# Patient Record
Sex: Male | Born: 1965 | Race: White | Hispanic: No | Marital: Married
Health system: Southern US, Community
[De-identification: ages and names within clinical notes are randomized; demographics above are authoritative.]

---

## 2007-05-09 ENCOUNTER — Ambulatory Visit: Payer: Self-pay | Admitting: Internal Medicine

## 2019-07-21 ENCOUNTER — Ambulatory Visit: Payer: 59 | Attending: Internal Medicine

## 2019-07-21 DIAGNOSIS — Z23 Encounter for immunization: Secondary | ICD-10-CM

## 2019-07-21 NOTE — Progress Notes (Signed)
   Covid-19 Vaccination Clinic  Name:  YANDRIEL BOENING    MRN: [id number] DOB: [birth year]  07/21/2019  Mr. Barrack was observed post Covid-19 immunization for 15 minutes without incident. He was provided with Vaccine Information Sheet and instruction to access the V-Safe system.   Mr. Berrocal was instructed to call 911 with any severe reactions post vaccine: Marland Kitchen Difficulty breathing  . Swelling of face and throat  . A fast heartbeat  . A bad rash all over body  . Dizziness and weakness   Immunizations Administered    Name Date Dose VIS Date Route   Pfizer COVID-19 Vaccine 07/21/2019  3:33 PM 0.3 mL 04/14/2019 Intramuscular   Manufacturer: [employer], [employer]   Lot: [id number]   NDC: [address]-3

## 2019-08-11 ENCOUNTER — Ambulatory Visit: Payer: 59 | Attending: Internal Medicine

## 2019-08-11 DIAGNOSIS — Z23 Encounter for immunization: Secondary | ICD-10-CM

## 2019-08-11 NOTE — Progress Notes (Signed)
   Covid-19 Vaccination Clinic  Name:  Andrew Washington    MRN: [id number] DOB: 1965/07/07  08/11/2019  [name] was observed post Covid-19 immunization for 15 minutes without incident. He was provided with Vaccine Information Sheet and instruction to access the V-Safe system.   [name] was instructed to call 911 with any severe reactions post vaccine: Marland Kitchen Difficulty breathing  . Swelling of face and throat  . A fast heartbeat  . A bad rash all over body  . Dizziness and weakness   Immunizations Administered    Name Date Dose VIS Date Route   Pfizer COVID-19 Vaccine 08/11/2019  3:15 PM 0.3 mL 04/14/2019 Intramuscular   Manufacturer: [employer], [employer]   Lot: [id number]   NDC: [address]-0

## 2021-09-26 ENCOUNTER — Other Ambulatory Visit: Payer: Self-pay | Admitting: Family Medicine

## 2021-09-26 DIAGNOSIS — M545 Low back pain, unspecified: Secondary | ICD-10-CM

## 2021-10-01 ENCOUNTER — Other Ambulatory Visit: Payer: Self-pay | Admitting: Family Medicine

## 2021-10-01 DIAGNOSIS — G8929 Other chronic pain: Secondary | ICD-10-CM

## 2021-10-03 ENCOUNTER — Ambulatory Visit
Admission: RE | Admit: 2021-10-03 | Discharge: 2021-10-03 | Disposition: A | Discharge status: Home / Self Care | Payer: Managed Care, Other (non HMO) | Source: Ambulatory Visit | Attending: Family Medicine | Admitting: Family Medicine

## 2021-10-03 DIAGNOSIS — G8929 Other chronic pain: Secondary | ICD-10-CM

## 2021-10-03 IMAGING — MR MR CERVICAL SPINE W/O CM
4 of 5 series · 27 of 48 positions shown · non-contrast
Comparison: None Available.

CLINICAL DATA: Initial evaluation for neck pain with numbness in
both arms.

EXAM:
MRI CERVICAL SPINE WITHOUT CONTRAST
TECHNIQUE: Multiplanar, multisequence MR imaging of the cervical spine was
performed. No intravenous contrast was administered.

[Series 6: T1 · sagittal · 3.0mm · 0.66mm/px · 6 of 15 slices shown]
[im 1/15]
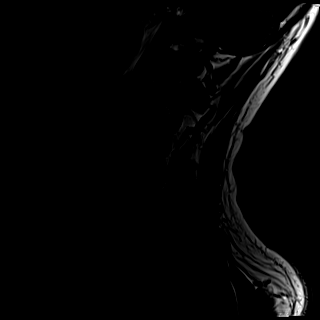
[im 3/15]
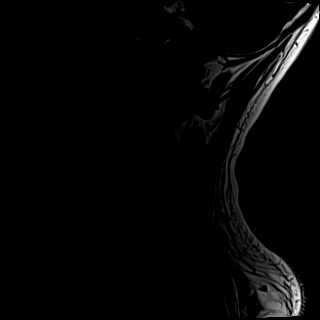
[im 6/15]
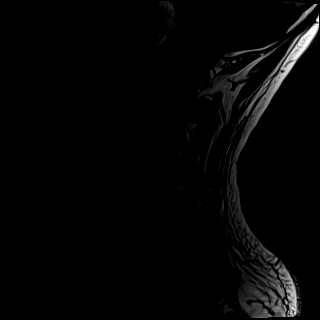
[im 9/15]
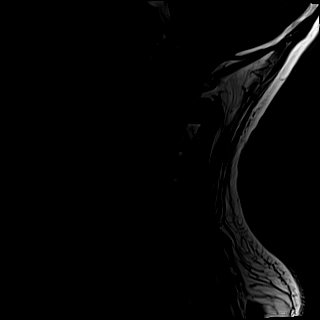
[im 12/15]
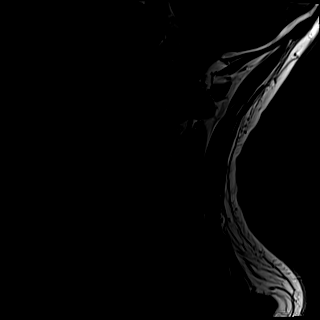
[im 15/15]
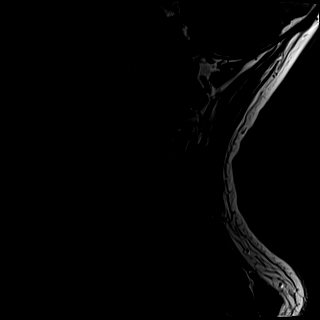

[Series 7: T2 · sagittal · 3.0mm · 0.55mm/px · 6 of 15 slices shown (1 of 2)]
[im 1/15]
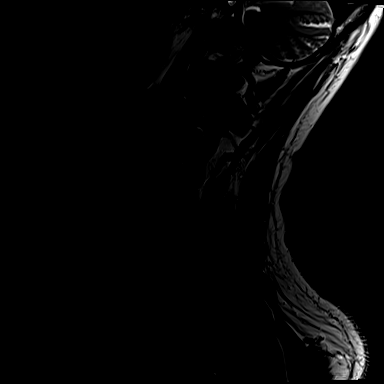
[im 3/15]
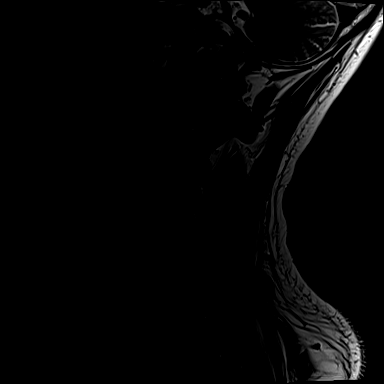
[im 6/15]
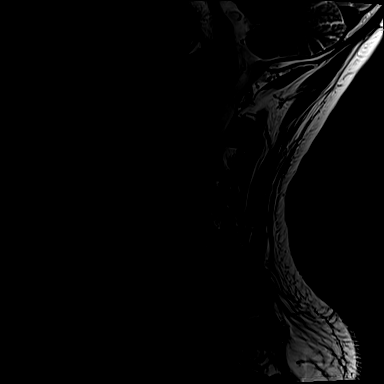
[im 9/15]
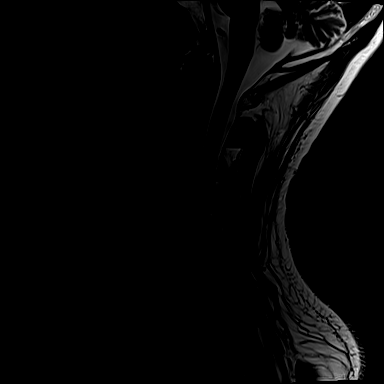
[im 12/15]
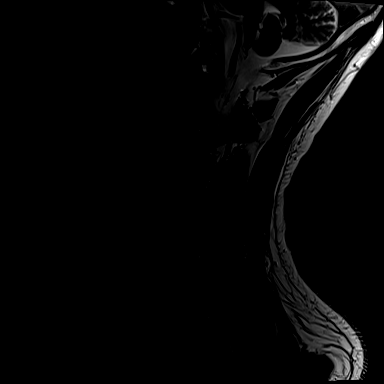
[im 15/15]
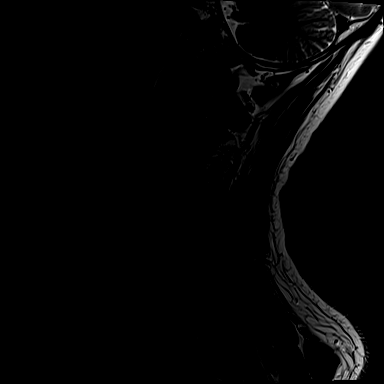

[Series 8: STIR · sagittal · 3.0mm · 0.33mm/px · 6 of 15 slices shown]
[im 1/15]
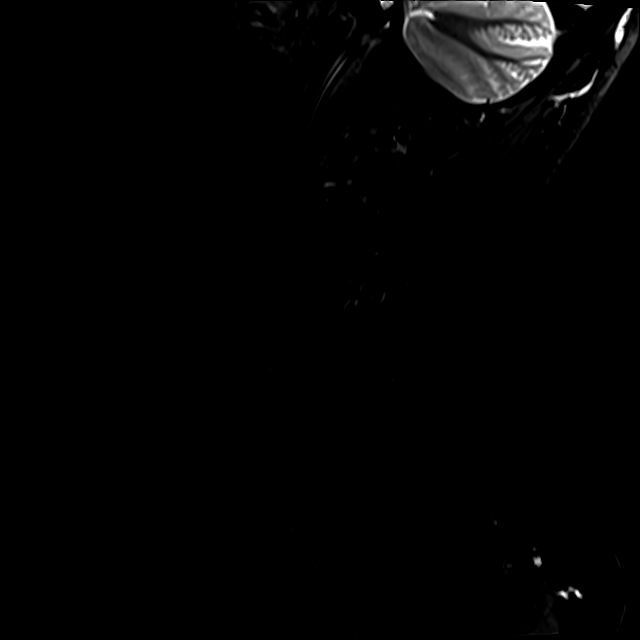
[im 3/15]
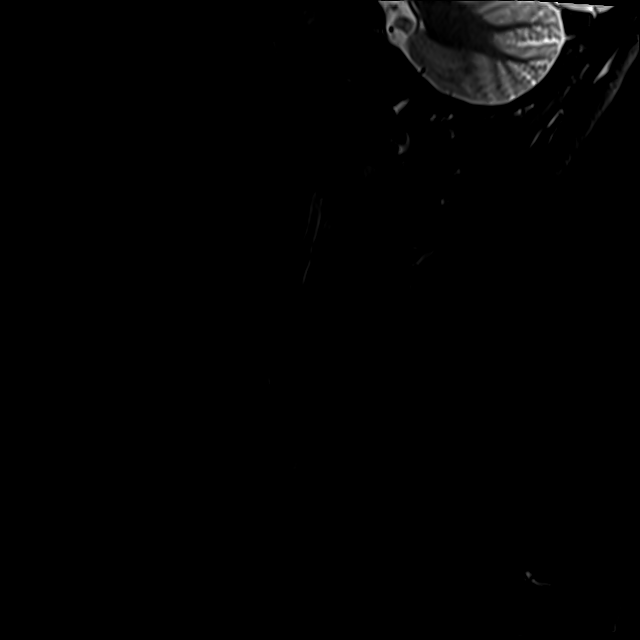
[im 6/15]
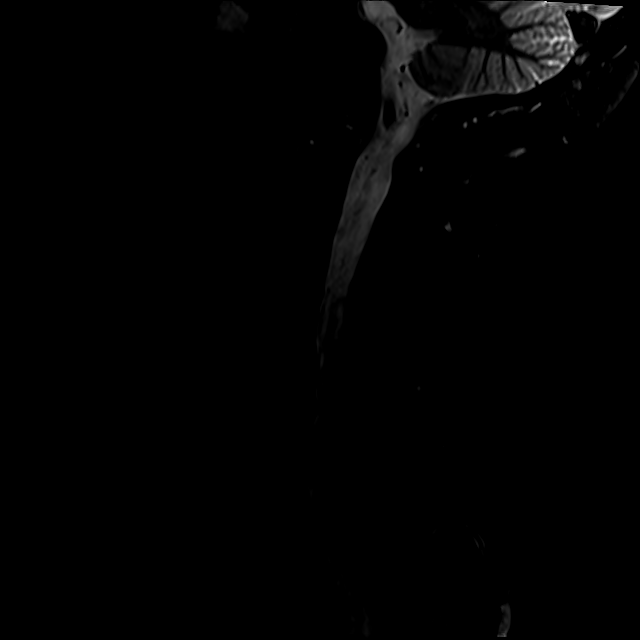
[im 9/15]
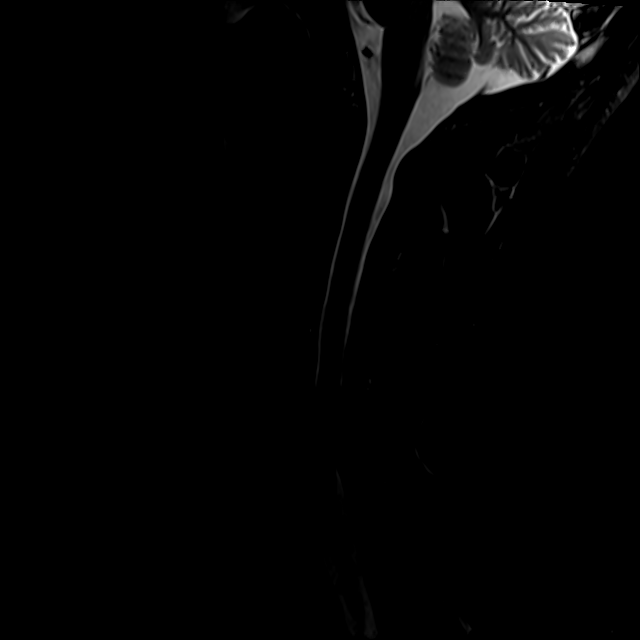
[im 12/15]
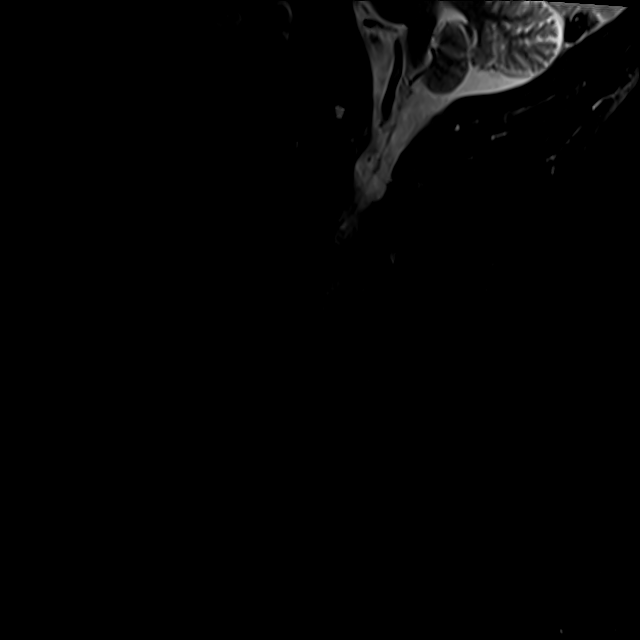
[im 15/15]
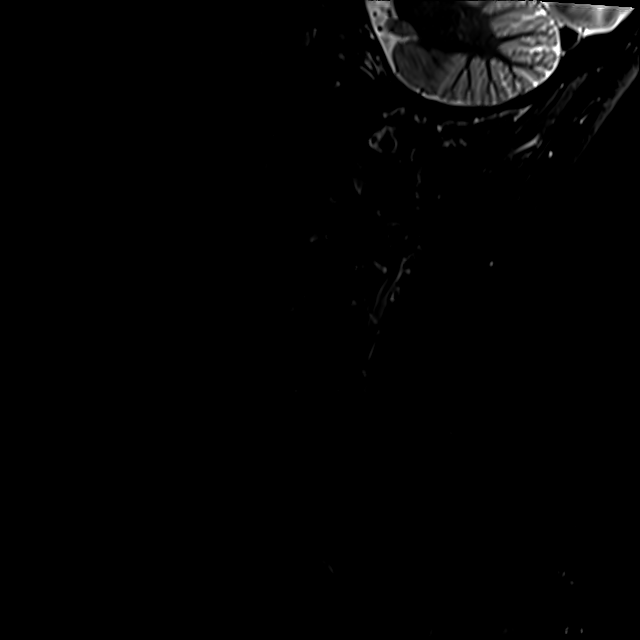

[Series 9: T2 · axial · 3.0mm · 0.50mm/px · z∈[-76,+40]mm · 9 of 38 slices shown (2 of 2)]
[im 1/38]
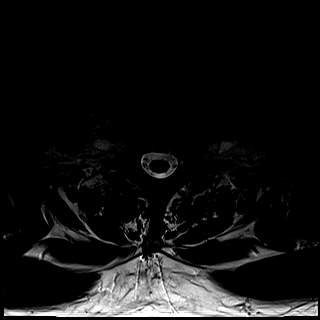
[im 6/38]
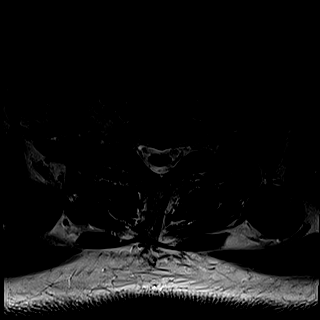
[im 11/38]
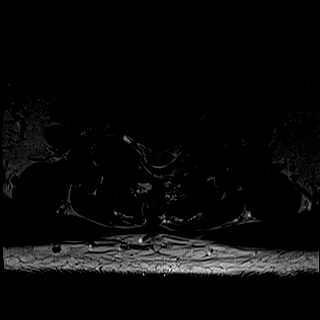
[im 16/38]
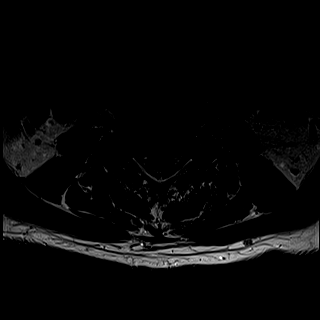
[im 19/38]
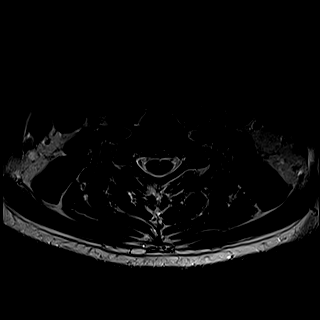
[im 22/38]
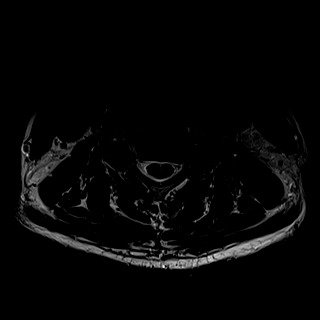
[im 27/38]
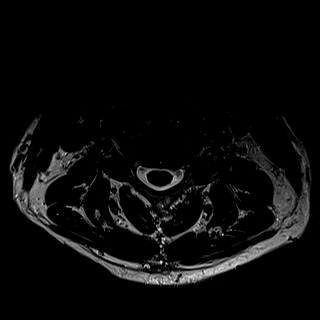
[im 32/38]
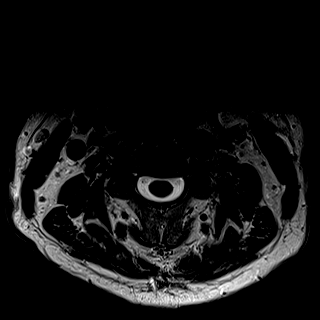
[im 38/38]
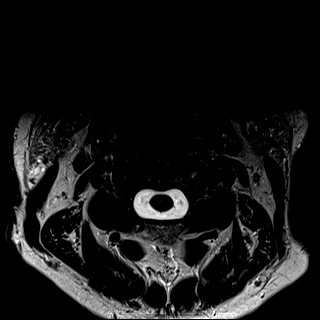

[27 of 48 positions shown; findings below may reference images not displayed]

FINDINGS: Alignment: Straightening of the normal cervical lordosis. No
significant listhesis.

Vertebrae: Vertebral body height maintained without acute or chronic
fracture. Bone marrow signal intensity within normal limits. No
discrete or worrisome osseous lesions. No abnormal marrow edema.

Cord: Normal signal and morphology.

Posterior Fossa, vertebral arteries, paraspinal tissues: Visualized
brain and posterior fossa within normal limits. Craniocervical
junction normal. Paraspinous and prevertebral soft tissues within
normal limits. Normal intravascular flow voids seen within the
vertebral arteries bilaterally.

Disc levels:

C2-C3: Unremarkable.

C3-C4: Mild uncovertebral hypertrophy without significant disc
bulge. Resultant mild left C4 foraminal stenosis. Right neural
foramen remains patent. No spinal stenosis.

C4-C5: Mild disc bulge with left greater than right uncovertebral
spurring. Mild facet hypertrophy. Superimposed tiny central disc
protrusion with annular fissure indents the ventral thecal sac. No
significant spinal stenosis. Severe left with mild right C5
foraminal stenosis.

C5-C6: Diffuse disc bulge with endplate and uncovertebral spurring,
worse on the right. Broad posterior disc osteophyte flattens and
effaces the ventral thecal sac. Resultant moderate spinal stenosis.
Severe right with moderate left C6 foraminal narrowing.

C6-C7: Central to right paracentral disc protrusion indents the
ventral thecal sac (series 9, image 28). Mild spinal stenosis.
Foramina remain patent.

C7-T1: Small central disc protrusion indents the ventral thecal sac
(series 10, image 34). No significant spinal stenosis. Foramina
remain patent.

Visualized upper thoracic spine demonstrates no significant finding.
IMPRESSION: 1. Degenerative disc osteophyte at C5-6 with resultant moderate
spinal stenosis, with severe right and moderate left C6 foraminal
narrowing.
2. Disc bulge with uncovertebral spurring at C4-5 with resultant
severe left C5 foraminal stenosis.
3. Small central to right paracentral disc protrusion at C6-7 with
resultant mild spinal stenosis.
4. Small central disc protrusions at C7-T1 and C4-5 without
significant stenosis.

## 2021-10-03 IMAGING — MR MR LUMBAR SPINE W/O CM
4 of 6 series · 21 of 48 positions shown · non-contrast
Comparison: None Available.

CLINICAL DATA: 55-year-old male with low back pain and spasms. Pain
radiating to the right leg. Prior surgery more than 5 years ago.

EXAM:
MRI LUMBAR SPINE WITHOUT CONTRAST
TECHNIQUE: Multiplanar, multisequence MR imaging of the lumbar spine was
performed. No intravenous contrast was administered.

[Series 6: T2 · sagittal · 4.0mm · 0.73mm/px · 4 of 15 slices shown (1 of 3)]
[im 1/15]
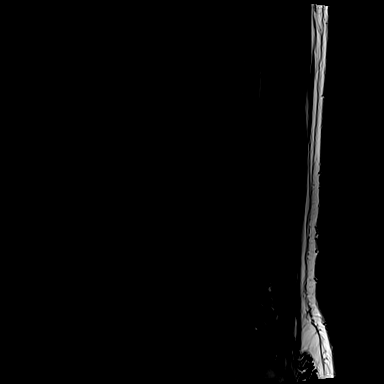
[im 5/15]
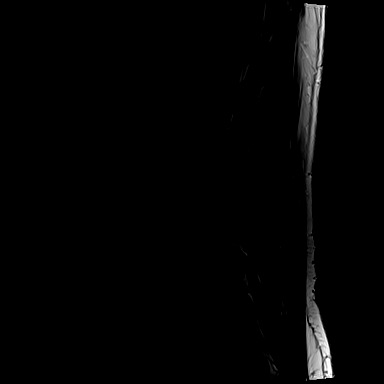
[im 10/15]
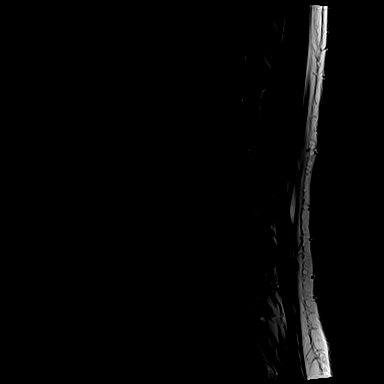
[im 15/15]
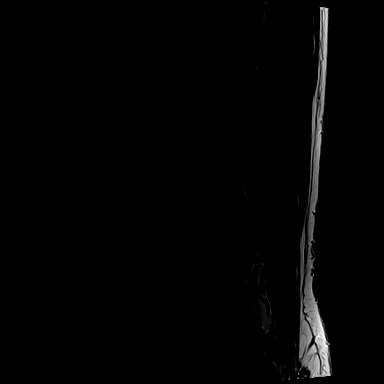

[Series 7: T1 · sagittal · 4.0mm · 0.73mm/px · 3 of 15 slices shown]
[im 1/15]
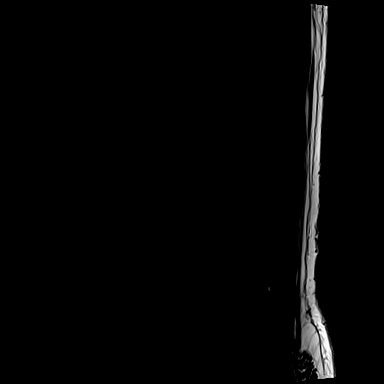
[im 8/15]
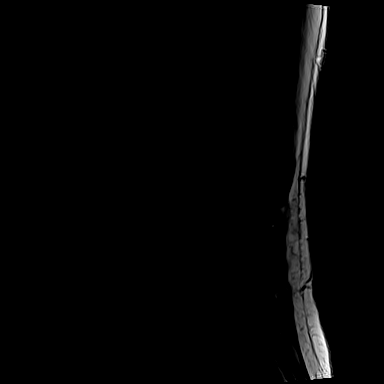
[im 15/15]
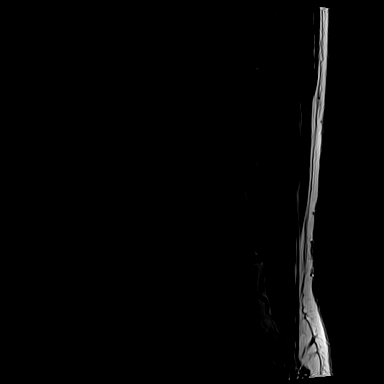

[Series 14: T2 · axial · 4.0mm · 0.28mm/px · z∈[-152,+84]mm · 8 of 44 slices shown (2 of 3)]
[im 1/44]
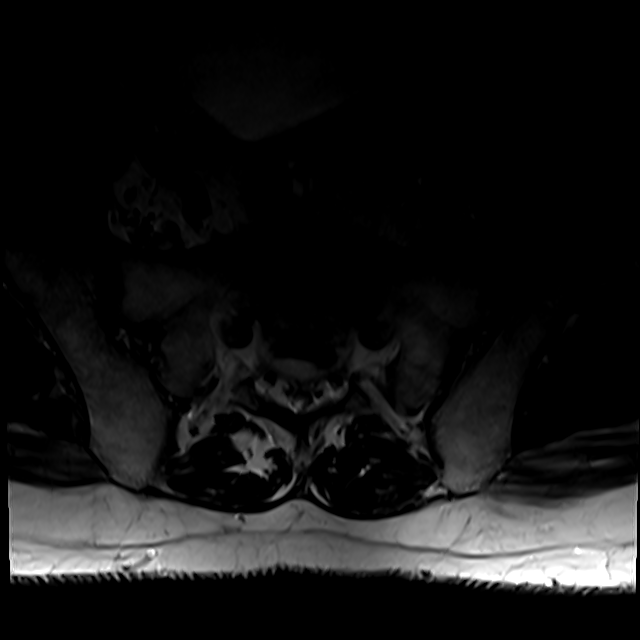
[im 7/44]
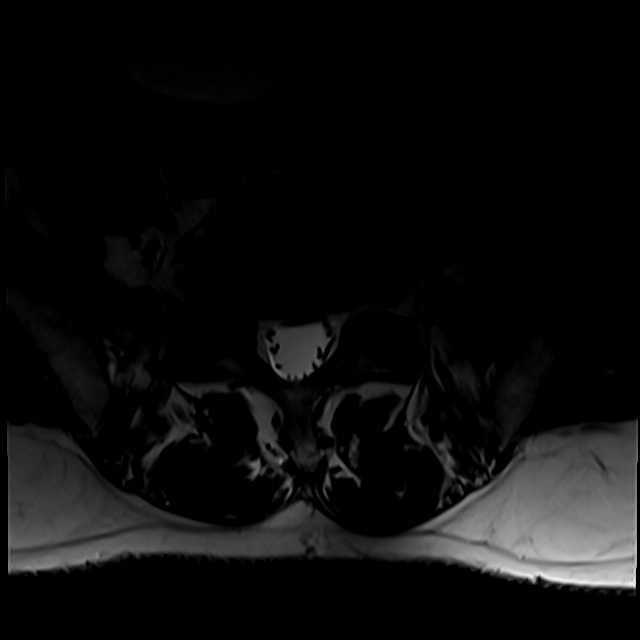
[im 14/44]
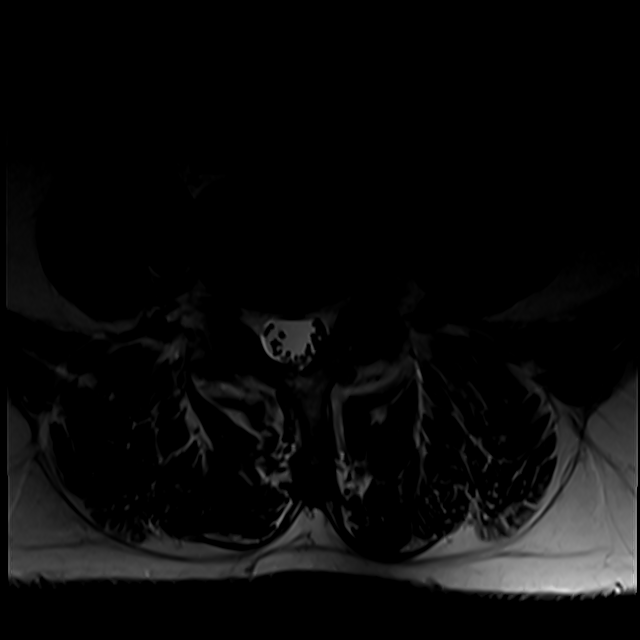
[im 20/44]
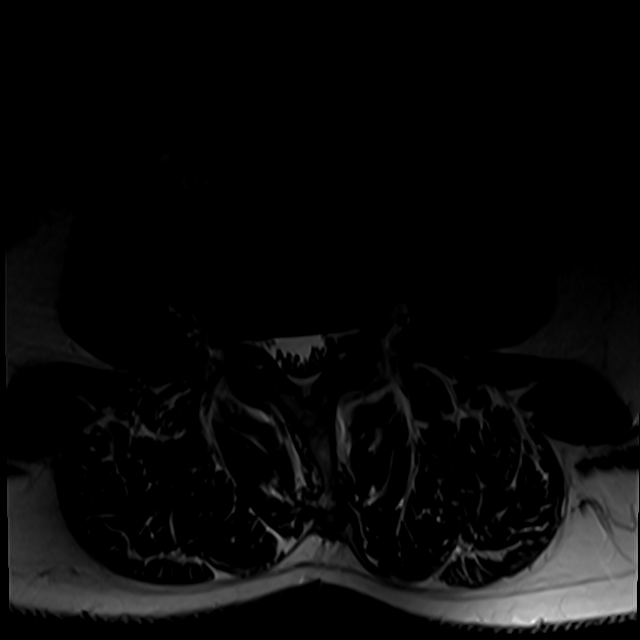
[im 24/44]
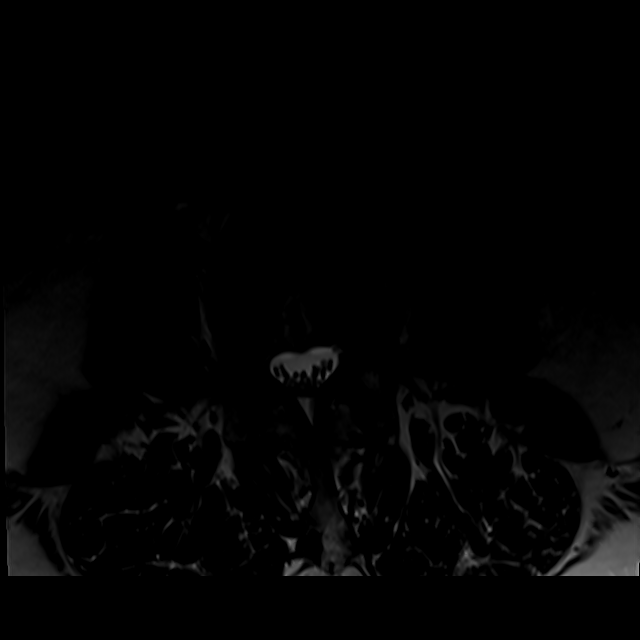
[im 30/44]
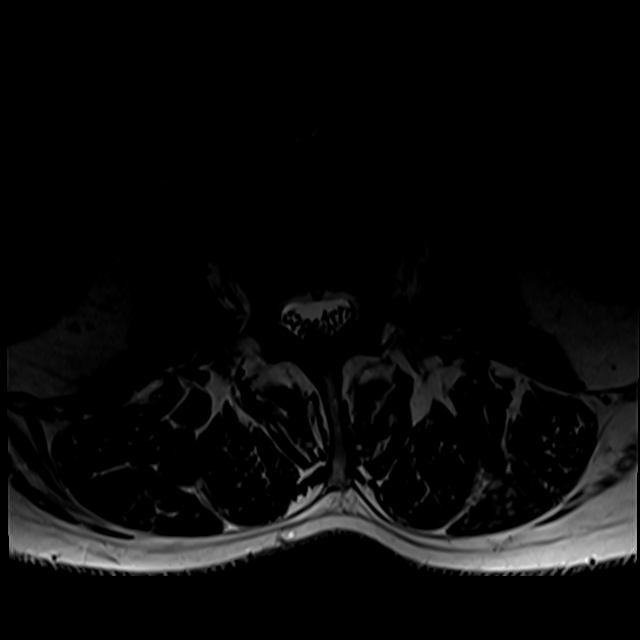
[im 37/44]
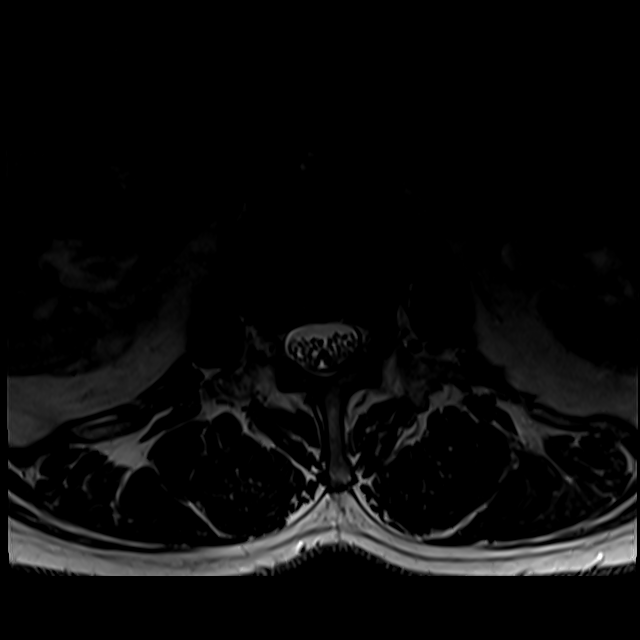
[im 44/44]
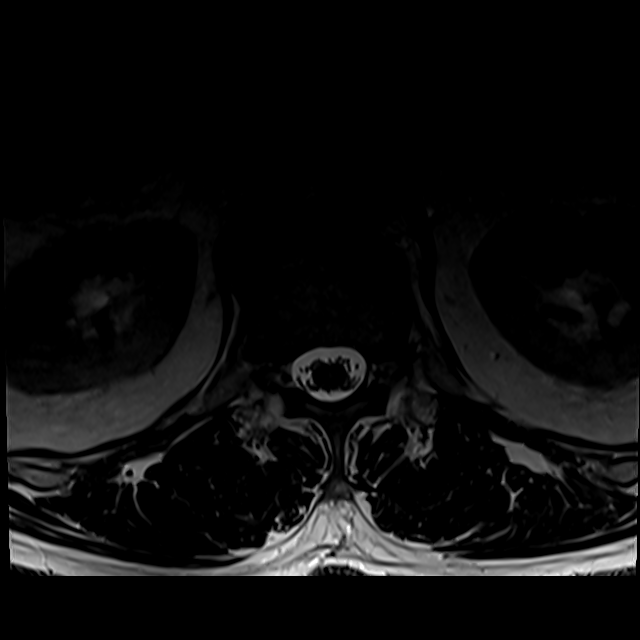

[Series 15: T2 · coronal · 5.0mm · 0.73mm/px · 6 of 19 slices shown (3 of 3)]
[im 1/19]
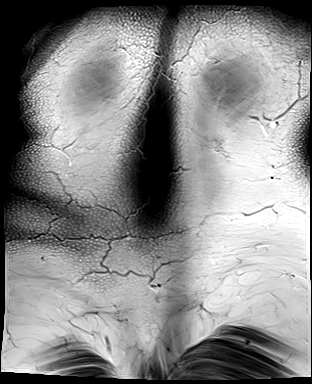
[im 4/19]
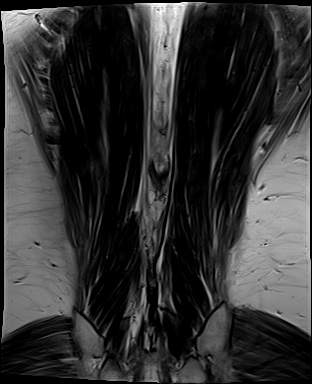
[im 8/19]
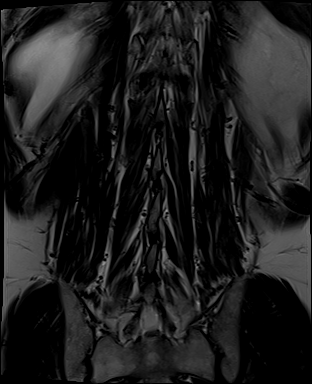
[im 11/19]
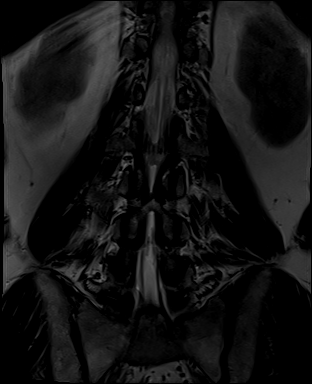
[im 15/19]
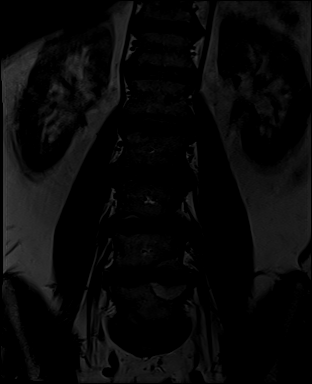
[im 19/19]
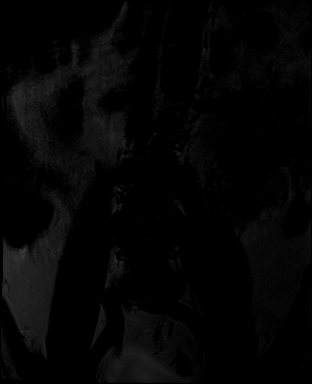

[21 of 48 positions shown; findings below may reference images not displayed]

FINDINGS: Segmentation: Lumbar segmentation appears to be normal and will be
designated as such for this report.

Alignment: Mild straightening of lumbar lordosis. Subtle
retrolisthesis of L5 on S1. No significant scoliosis.

Vertebrae: Patchy degenerative appearing acute on chronic marrow
signal changes at the endplates of L4, L5, S1. This is eccentric to
the right (series 8, image 5). No endplate erosion. Background bone
marrow signal is normal. Intact visible sacrum and SI joints.

Conus medullaris and cauda equina: Conus extends to the L1 level. No
lower spinal cord or conus signal abnormality.

Paraspinal and other soft tissues: Negative except for partially
visible distended urinary bladder (series 14, image 44).

Disc levels:

T11-T12: Negative.

T12-L1:  Negative.

L1-L2: Mild far lateral disc bulging. Mild facet and ligament flavum
hypertrophy. No stenosis.

L2-L3: Subtle disc desiccation. Mild circumferential disc bulge.
Mild facet hypertrophy. Borderline to mild spinal stenosis. No
convincing lateral recess or foraminal stenosis.

L3-L4: Mild circumferential disc bulge and posterior element
hypertrophy. No spinal or lateral recess stenosis. Mild to moderate
left greater than right L3 neural foraminal stenosis.

L4-L5: Circumferential disc bulge. Mild posterior element
hypertrophy. No spinal or lateral recess stenosis. Moderate left L4
neural foraminal stenosis (series 6, image 11). No significant right
foraminal stenosis.

L5-S1: Mostly far lateral disc bulging and endplate spurring. Mild
facet hypertrophy primarily on the right. Capacious spinal canal
with no lateral recess stenosis. Mild to moderate right L5 foraminal
stenosis. No left foraminal stenosis.
IMPRESSION: 1. Acute on chronic degenerative endplate marrow changes with marrow
edema eccentric to the right at L4, L5 and S1. No other acute
osseous abnormality.

2. No lumbar spinal stenosis. Borderline to mild multifactorial
spinal stenosis at L2-L3. And moderate neural foraminal stenosis at
the left L3, left L4, and right L5 nerve levels.
